# Patient Record
Sex: Female | Born: 1975 | Race: White | Hispanic: No | Marital: Married | Smoking: Never smoker
Health system: Southern US, Community
[De-identification: ages and names within clinical notes are randomized; demographics above are authoritative.]

## PROBLEM LIST (undated history)

## (undated) DIAGNOSIS — J302 Other seasonal allergic rhinitis: Secondary | ICD-10-CM

## (undated) HISTORY — PX: NO PAST SURGERIES: SHX2092

## (undated) HISTORY — PX: WISDOM TOOTH EXTRACTION: SHX21

## (undated) HISTORY — PX: EYE SURGERY: SHX253

---

## 2000-02-15 ENCOUNTER — Other Ambulatory Visit: Admission: RE | Admit: 2000-02-15 | Discharge: 2000-02-15 | Payer: Self-pay | Admitting: *Deleted

## 2004-09-25 ENCOUNTER — Other Ambulatory Visit: Admission: RE | Admit: 2004-09-25 | Discharge: 2004-09-25 | Payer: Self-pay | Admitting: *Deleted

## 2007-09-23 ENCOUNTER — Ambulatory Visit: Payer: Self-pay | Admitting: Sports Medicine

## 2007-09-23 DIAGNOSIS — M2020 Hallux rigidus, unspecified foot: Secondary | ICD-10-CM | POA: Insufficient documentation

## 2007-09-23 DIAGNOSIS — M7750 Other enthesopathy of unspecified foot: Secondary | ICD-10-CM | POA: Insufficient documentation

## 2008-09-03 ENCOUNTER — Encounter: Payer: Self-pay | Admitting: Cardiology

## 2008-09-28 ENCOUNTER — Encounter: Payer: Self-pay | Admitting: Cardiology

## 2008-11-13 ENCOUNTER — Ambulatory Visit: Payer: Self-pay | Admitting: Cardiology

## 2008-11-13 DIAGNOSIS — I059 Rheumatic mitral valve disease, unspecified: Secondary | ICD-10-CM | POA: Insufficient documentation

## 2008-11-28 ENCOUNTER — Telehealth: Payer: Self-pay | Admitting: Cardiology

## 2008-11-30 ENCOUNTER — Encounter: Payer: Self-pay | Admitting: Cardiology

## 2008-11-30 ENCOUNTER — Ambulatory Visit: Payer: Self-pay

## 2009-04-14 ENCOUNTER — Inpatient Hospital Stay (HOSPITAL_COMMUNITY): Admission: AD | Admit: 2009-04-14 | Discharge: 2009-04-17 | Payer: Self-pay | Admitting: Obstetrics

## 2010-05-18 NOTE — L&D Delivery Note (Addendum)
Called to attend due to rapid progression.  Pt had a SVD of Viable Female infant, no complications. TIme of birth and weight pending.  Placenta delivered intact via CCT within 10 minutes of delivery. EBL ~ 200cc.  2nd degree perineal lac.  Dr. Cherly Hensen arrived shortly after placenta..  Delivery Note At 3:04 AM a viable and healthy female was delivered via Vaginal, Spontaneous Delivery (Presentation: ;  ).  APGAR: 9, 9; weight .   Placenta status: Intact Abnormal, Spontaneous.  Cord: 3 vessels with the following complications: None.  Cord pH: none  Anesthesia: None  Episiotomy: None Lacerations: 2nd degree Suture Repair: 3.0 chromic Est. Blood Loss (mL):   Mom to postpartum.  Baby to nursery-stable.  Nesiah Jump A 05/15/2011, 4:00 AM   Called by RN  @ 2:48 am that pt was 7 cm ROM. Orders given for admit.  Received called at home  Less than 5 mins later that pt had delivered in MAU. On arrival, placenta was delivered and 2nd degree perineal laceration was in need of repair

## 2010-08-20 LAB — URIC ACID: Uric Acid, Serum: 3.6 mg/dL (ref 2.4–7.0)

## 2010-08-20 LAB — URINALYSIS, ROUTINE W REFLEX MICROSCOPIC
Nitrite: NEGATIVE
Protein, ur: NEGATIVE mg/dL
Specific Gravity, Urine: 1.015 (ref 1.005–1.030)
Urobilinogen, UA: 0.2 mg/dL (ref 0.0–1.0)

## 2010-08-20 LAB — COMPREHENSIVE METABOLIC PANEL
ALT: 19 U/L (ref 0–35)
AST: 35 U/L (ref 0–37)
Albumin: 2.6 g/dL — ABNORMAL LOW (ref 3.5–5.2)
Alkaline Phosphatase: 201 U/L — ABNORMAL HIGH (ref 39–117)
Chloride: 104 mEq/L (ref 96–112)
GFR calc Af Amer: >60 mL/min (ref >60)
Potassium: 3.9 mEq/L (ref 3.5–5.1)
Total Bilirubin: 0.4 mg/dL (ref 0.3–1.2)

## 2010-08-20 LAB — CBC
HCT: 32.6 % — ABNORMAL LOW (ref 36.0–46.0)
MCHC: 33.6 g/dL (ref 30.0–36.0)
MCV: 86.4 fL (ref 78.0–100.0)
Platelets: 302 10*3/uL (ref 150–400)
RBC: 3.77 MIL/uL — ABNORMAL LOW (ref 3.87–5.11)
WBC: 12.6 10*3/uL — ABNORMAL HIGH (ref 4.0–10.5)
WBC: 16.6 10*3/uL — ABNORMAL HIGH (ref 4.0–10.5)

## 2010-08-20 LAB — URINE MICROSCOPIC-ADD ON

## 2011-02-24 ENCOUNTER — Ambulatory Visit
Admission: RE | Admit: 2011-02-24 | Discharge: 2011-02-24 | Disposition: A | Discharge status: Home / Self Care | Payer: BC Managed Care – PPO | Source: Ambulatory Visit | Attending: Obstetrics | Admitting: Obstetrics

## 2011-02-24 ENCOUNTER — Other Ambulatory Visit: Payer: Self-pay | Admitting: Obstetrics

## 2011-02-24 DIAGNOSIS — R0602 Shortness of breath: Secondary | ICD-10-CM

## 2011-02-24 DIAGNOSIS — R05 Cough: Secondary | ICD-10-CM

## 2011-02-24 DIAGNOSIS — IMO0001 Reserved for inherently not codable concepts without codable children: Secondary | ICD-10-CM

## 2011-02-24 DIAGNOSIS — R059 Cough, unspecified: Secondary | ICD-10-CM

## 2011-02-24 DIAGNOSIS — R062 Wheezing: Secondary | ICD-10-CM

## 2011-05-15 ENCOUNTER — Encounter (HOSPITAL_COMMUNITY): Payer: Self-pay | Admitting: *Deleted

## 2011-05-15 ENCOUNTER — Inpatient Hospital Stay (HOSPITAL_COMMUNITY)
Admission: AD | Admit: 2011-05-15 | Discharge: 2011-05-16 | DRG: 373 | Disposition: A | Discharge status: Home / Self Care | Payer: BC Managed Care – PPO | Source: Ambulatory Visit | Attending: Obstetrics and Gynecology | Admitting: Obstetrics and Gynecology

## 2011-05-15 DIAGNOSIS — O09529 Supervision of elderly multigravida, unspecified trimester: Secondary | ICD-10-CM | POA: Diagnosis present

## 2011-05-15 MED ORDER — IBUPROFEN 600 MG PO TABS
600.0000 mg | ORAL_TABLET | Freq: Four times a day (QID) | ORAL | Status: DC
  Administered 2011-05-15 – 2011-05-16 (×6): 600 mg via ORAL
  Filled 2011-05-15 (×6): qty 1

## 2011-05-15 MED ORDER — ONDANSETRON HCL 4 MG PO TABS: 4.0000 mg | ORAL_TABLET | ORAL | Status: DC | PRN

## 2011-05-15 MED ORDER — LORATADINE 10 MG PO TABS
10.0000 mg | ORAL_TABLET | Freq: Every day | ORAL | Status: DC
  Administered 2011-05-15: 10 mg via ORAL
  Filled 2011-05-15 (×2): qty 1

## 2011-05-15 MED ORDER — ZOLPIDEM TARTRATE 5 MG PO TABS: 5.0000 mg | ORAL_TABLET | Freq: Every evening | ORAL | Status: DC | PRN

## 2011-05-15 MED ORDER — ONDANSETRON HCL 4 MG/2ML IJ SOLN: 4.0000 mg | INTRAMUSCULAR | Status: DC | PRN

## 2011-05-15 MED ORDER — WITCH HAZEL-GLYCERIN EX PADS
1.0000 "application " | MEDICATED_PAD | CUTANEOUS | Status: DC | PRN
  Administered 2011-05-15: 1 via TOPICAL

## 2011-05-15 MED ORDER — OXYTOCIN 10 UNIT/ML IJ SOLN
INTRAMUSCULAR | Status: AC
  Filled 2011-05-15: qty 1

## 2011-05-15 MED ORDER — DIBUCAINE 1 % RE OINT
1.0000 "application " | TOPICAL_OINTMENT | RECTAL | Status: DC | PRN
  Administered 2011-05-15: 1 via RECTAL
  Filled 2011-05-15: qty 28

## 2011-05-15 MED ORDER — LORATADINE 10 MG PO TABS
10.0000 mg | ORAL_TABLET | Freq: Every day | ORAL | Status: DC
  Filled 2011-05-15 (×2): qty 1

## 2011-05-15 MED ORDER — DIPHENHYDRAMINE HCL 25 MG PO CAPS: 25.0000 mg | ORAL_CAPSULE | Freq: Four times a day (QID) | ORAL | Status: DC | PRN

## 2011-05-15 MED ORDER — BUDESONIDE 180 MCG/ACT IN AEPB: 2.0000 | INHALATION_SPRAY | Freq: Two times a day (BID) | RESPIRATORY_TRACT | Status: DC

## 2011-05-15 MED ORDER — LANOLIN HYDROUS EX OINT: TOPICAL_OINTMENT | CUTANEOUS | Status: DC | PRN

## 2011-05-15 MED ORDER — SIMETHICONE 80 MG PO CHEW: 80.0000 mg | CHEWABLE_TABLET | ORAL | Status: DC | PRN

## 2011-05-15 MED ORDER — BENZOCAINE-MENTHOL 20-0.5 % EX AERO
INHALATION_SPRAY | CUTANEOUS | Status: AC
  Administered 2011-05-15: 1 via TOPICAL
  Filled 2011-05-15: qty 56

## 2011-05-15 MED ORDER — BENZOCAINE-MENTHOL 20-0.5 % EX AERO
1.0000 "application " | INHALATION_SPRAY | CUTANEOUS | Status: DC | PRN
  Administered 2011-05-15: 1 via TOPICAL

## 2011-05-15 MED ORDER — TETANUS-DIPHTH-ACELL PERTUSSIS 5-2.5-18.5 LF-MCG/0.5 IM SUSP: 0.5000 mL | Freq: Once | INTRAMUSCULAR | Status: DC

## 2011-05-15 MED ORDER — SENNOSIDES-DOCUSATE SODIUM 8.6-50 MG PO TABS
2.0000 | ORAL_TABLET | Freq: Every day | ORAL | Status: DC
  Administered 2011-05-15: 2 via ORAL

## 2011-05-15 MED ORDER — PRENATAL MULTIVITAMIN CH
1.0000 | ORAL_TABLET | Freq: Every day | ORAL | Status: DC
  Administered 2011-05-15 – 2011-05-16 (×2): 1 via ORAL
  Filled 2011-05-15 (×2): qty 1

## 2011-05-15 MED ORDER — FLUTICASONE PROPIONATE HFA 44 MCG/ACT IN AERO: 2.0000 | INHALATION_SPRAY | Freq: Two times a day (BID) | RESPIRATORY_TRACT | Status: DC

## 2011-05-15 MED ORDER — OXYCODONE-ACETAMINOPHEN 5-325 MG PO TABS: 1.0000 | ORAL_TABLET | ORAL | Status: DC | PRN

## 2011-05-15 NOTE — Progress Notes (Signed)
Entered room to find pt standing over bed bearing down.  Pt having bowel movement on floor.  Requested pt move to bed.  CNM F. Cres-Dishmon at desk and called to room.  Pt 10/100/+4.

## 2011-05-15 NOTE — Progress Notes (Signed)
PPD 0 SVD - precip birth in MAU  S:  Reports feeling well, denies PEC s/s             Tolerating po/ No nausea or vomiting             Bleeding is light             Pain controlled with motrin             Up ad lib / ambulatory  Newborn  Information for the patient's newborn:  Priscilla, Kirstein [[id number]]  female  breast feeding  / Circumcision planned   O:  A & O x 3 NAD             VS:  Filed Vitals:   05/15/11 0605 05/15/11 0609 05/15/11 0610 05/15/11 1000  BP: 132/86 128/90 134/93 118/76  Pulse: 76 83 99 71  Temp: 97.4 F (36.3 C)   97.9 F (36.6 C)  TempSrc: Oral   Oral  Resp: 16   18  Height:      Weight:        LABS: No results found for this basename: HGB:2,HCT:2 in the last 72 hours  I&O:        Lungs: Clear and unlabored  Heart: regular rate and rhythm / no mumurs  Abdomen: soft, non-tender, non-distended              Fundus: firm, non-tender, U-1  Perineum: repair intact, no edema, ice in place  Lochia: small  Extremities: no edema, no calf pain or tenderness, neg  Homans    A/P: PPD # 0 35 y.o., G3P1001 S/P:spontaneous vaginal   Principal Problem:  *PP care - s/p SVD 12/28   Doing well - stable status  Routine post partum orders  Labile BP, cont to monitor.  Arlan Organ CNM, MSN 05/15/2011, 11:27 AM

## 2011-05-15 NOTE — Progress Notes (Signed)
Pt up ambulating around bed per request.

## 2011-05-15 NOTE — Progress Notes (Signed)
Pt to room 112 at this time.

## 2011-05-15 NOTE — Progress Notes (Signed)
Pt presents to mau for c/o ctx and ROM.  States medium gush of fluid at 0124am.  Appeared clear.  Ctx followed.

## 2011-05-15 NOTE — H&P (Signed)
Chelsea Austin is a 35 y.o. female presenting for admission 2nd to active labor and subsequent delivery in MAU( see delivery note) Maternal Medical History:  Reason for admission: Reason for admission: rupture of membranes.  Fetal activity: Perceived fetal activity is normal.      OB History    Grav Para Term Preterm Abortions TAB SAB Ect Mult Living   3 2 1       1      Past Medical History  Diagnosis Date  . Asthma    Past Surgical History  Procedure Date  . No past surgeries    Family History: family history is not on file. Social History:  reports that she has never smoked. She does not have any smokeless tobacco history on file. She reports that she does not drink alcohol or use illicit drugs.  ROS  Dilation: 7 Effacement (%): 100 Station: 0;-1 Exam by:: Humphrey Rolls, RN Blood pressure 147/77, pulse 83, unknown if currently breastfeeding. Exam Physical Exam  Constitutional: She is oriented to person, place, and time. She appears well-developed and well-nourished.  HENT:  Head: Normocephalic.  Eyes: EOM are normal.  Cardiovascular: Regular rhythm.   Respiratory: Breath sounds normal.  GI:       Uterus firm @ umb sl tender  Musculoskeletal: She exhibits no edema.  Neurological: She is alert and oriented to person, place, and time.  Skin: Skin is warm.  Psychiatric: She has a normal mood and affect.   VE: see delivery note  Prenatal labs: ABO, Rh:   B positive Antibody:  neg Rubella:  Immune RPR:   NR HBsAg:   neg HIV:   neg GBS:   neg  Assessment/Plan: S/P SVD  P) admit. Routine labs. Routine pp care   Chelsea Austin A 05/15/2011, 4:05 AM

## 2011-05-15 NOTE — Progress Notes (Signed)
Notified of VE and ctx pattern. Notified of SROM.  Admit orders received.

## 2011-05-15 NOTE — Progress Notes (Deleted)
Pt ambulating around bed per request.

## 2011-05-15 NOTE — Progress Notes (Signed)
Dr. Cherly Hensen at bedside. Here for vaginal repair.

## 2011-05-15 NOTE — Progress Notes (Signed)
Pt may go to room 165. 

## 2011-05-16 LAB — CBC
Hemoglobin: 11.6 g/dL — ABNORMAL LOW (ref 12.0–15.0)
MCH: 27.3 pg (ref 26.0–34.0)
RBC: 4.25 MIL/uL (ref 3.87–5.11)
RDW: 14.6 % (ref 11.5–15.5)

## 2011-05-16 MED ORDER — ACETAMINOPHEN 325 MG PO TABS: ORAL_TABLET | ORAL | Status: DC

## 2011-05-16 MED ORDER — IBUPROFEN 200 MG PO TABS: ORAL_TABLET | ORAL | Status: DC

## 2011-05-16 NOTE — Discharge Summary (Signed)
Obstetric Discharge Summary Reason for Admission: onset of labor Prenatal Procedures: none Intrapartum Procedures: spontaneous vaginal delivery Postpartum Procedures: none Complications-Operative and Postpartum:  2nd  degree perineal laceration and precipitous labor / birth (<3hr)  LABS: Hemoglobin  Date Value Range Status  05/16/2011 11.6* 12.0-15.0 (g/dL) Final     HCT  Date Value Range Status  05/16/2011 35.4* 36.0-46.0 (%) Final    Discharge Diagnoses: Term Pregnancy-delivered after precipitous labor & birth (<3 hrs)  Discharge Information: Date: 05/16/2011 Activity: pelvic rest Diet: routine Medications: PNV, Ibuprofen and tylenol / albuteral / zrytec Condition: stable Instructions: refer to practice specific booklet Discharge to: home  Follow-up Information    Follow up with COUSINS,SHERONETTE A, MD. Make an appointment in 6 weeks.   Contact information:   [address] [place] [address] [phone]          Newborn Data: Live born female  Birth Weight: 9 lb 4 oz (4196 g) APGAR: 9, 9  Home with mother after newborn circumcision on PPD #1.  Teddi Badalamenti 05/16/2011, 9:19 AM

## 2011-05-16 NOTE — Progress Notes (Signed)
Patient ID: Chelsea Austin, female   DOB: November 30, 1975, 35 y.o.   MRN: [id number]  PPD 1 SVD  S:  Reports feeling well - desires early discharge to home today after newborn circ             Tolerating po/ No nausea or vomiting             Bleeding is light             Pain controlled withprescription NSAID's including motrin             Up ad lib / ambulatory  Newborn breast feeding  / Circumcision requested today   O:  A & O x 3 NAD             VS: Blood pressure 130/88, pulse 74, temperature 98.4 F (36.9 C), temperature source Oral, resp. rate 18, height 5\' 6"  (1.676 m), weight 80.287 kg (177 lb), unknown if currently breastfeeding.  LABS: WBC/Hgb/Hct/Plts:  10.1/11.6/35.4/222 (12/29 0536)   Lungs: Clear and unlabored  Heart: regular rate and rhythm   Abdomen: soft, non-tender, non-distended              Fundus: firm, non-tender, U-1  Lochia: light  Extremities: no edema, no calf pain or tenderness    A: PPD # 1   Doing well - stable status  P:  Routine post partum orders  Discharge home early today after newborn circ  [name] 05/16/2011, 9:11 AM

## 2012-08-22 ENCOUNTER — Encounter (HOSPITAL_COMMUNITY): Payer: Self-pay | Admitting: *Deleted

## 2012-08-26 ENCOUNTER — Encounter (HOSPITAL_COMMUNITY): Payer: Self-pay | Admitting: Pharmacist

## 2012-09-01 ENCOUNTER — Other Ambulatory Visit: Payer: Self-pay | Admitting: Obstetrics

## 2012-09-09 ENCOUNTER — Ambulatory Visit (HOSPITAL_COMMUNITY)
Admission: RE | Admit: 2012-09-09 | Discharge: 2012-09-09 | Disposition: A | Discharge status: Home / Self Care | Payer: BC Managed Care – PPO | Source: Ambulatory Visit | Attending: Obstetrics | Admitting: Obstetrics

## 2012-09-09 ENCOUNTER — Encounter (HOSPITAL_COMMUNITY)
Admission: RE | Disposition: A | Discharge status: Home / Self Care | Payer: Self-pay | Source: Ambulatory Visit | Attending: Obstetrics

## 2012-09-09 ENCOUNTER — Encounter (HOSPITAL_COMMUNITY): Payer: Self-pay | Admitting: Anesthesiology

## 2012-09-09 ENCOUNTER — Encounter (HOSPITAL_COMMUNITY): Payer: Self-pay | Admitting: *Deleted

## 2012-09-09 ENCOUNTER — Ambulatory Visit (HOSPITAL_COMMUNITY): Payer: BC Managed Care – PPO | Admitting: Anesthesiology

## 2012-09-09 DIAGNOSIS — T8339XA Other mechanical complication of intrauterine contraceptive device, initial encounter: Secondary | ICD-10-CM | POA: Insufficient documentation

## 2012-09-09 DIAGNOSIS — Z302 Encounter for sterilization: Secondary | ICD-10-CM | POA: Insufficient documentation

## 2012-09-09 HISTORY — PX: LAPAROSCOPIC TUBAL LIGATION: SHX1937

## 2012-09-09 HISTORY — PX: HYSTEROSCOPY WITH D & C: SHX1775

## 2012-09-09 HISTORY — PX: IUD REMOVAL: SHX5392

## 2012-09-09 HISTORY — DX: Other seasonal allergic rhinitis: J30.2

## 2012-09-09 LAB — CBC
HCT: 41.2 % (ref 36.0–46.0)
Hemoglobin: 14.1 g/dL (ref 12.0–15.0)
RBC: 4.92 MIL/uL (ref 3.87–5.11)
WBC: 6.1 10*3/uL (ref 4.0–10.5)

## 2012-09-09 LAB — ABO/RH: ABO/RH(D): B POS

## 2012-09-09 LAB — TYPE AND SCREEN: Antibody Screen: NEGATIVE

## 2012-09-09 SURGERY — LIGATION, FALLOPIAN TUBE, LAPAROSCOPIC
Anesthesia: General | Laterality: Bilateral | Wound class: Clean

## 2012-09-09 MED ORDER — ONDANSETRON HCL 4 MG/2ML IJ SOLN
INTRAMUSCULAR | Status: DC | PRN
  Administered 2012-09-09: 4 mg via INTRAVENOUS

## 2012-09-09 MED ORDER — LIDOCAINE HCL (CARDIAC) 20 MG/ML IV SOLN
INTRAVENOUS | Status: DC | PRN
  Administered 2012-09-09: 60 mg via INTRAVENOUS

## 2012-09-09 MED ORDER — PROPOFOL 10 MG/ML IV EMUL
INTRAVENOUS | Status: DC | PRN
  Administered 2012-09-09: 200 mg via INTRAVENOUS

## 2012-09-09 MED ORDER — FENTANYL CITRATE 0.05 MG/ML IJ SOLN
25.0000 ug | INTRAMUSCULAR | Status: DC | PRN
  Administered 2012-09-09: 50 ug via INTRAVENOUS

## 2012-09-09 MED ORDER — BUPIVACAINE HCL 0.5 % IJ SOLN
INTRAMUSCULAR | Status: DC | PRN
  Administered 2012-09-09: 20 mL

## 2012-09-09 MED ORDER — FENTANYL CITRATE 0.05 MG/ML IJ SOLN
INTRAMUSCULAR | Status: AC
  Filled 2012-09-09: qty 5

## 2012-09-09 MED ORDER — LIDOCAINE HCL (CARDIAC) 20 MG/ML IV SOLN
INTRAVENOUS | Status: AC
  Filled 2012-09-09: qty 5

## 2012-09-09 MED ORDER — KETOROLAC TROMETHAMINE 30 MG/ML IJ SOLN: 15.0000 mg | Freq: Once | INTRAMUSCULAR | Status: DC | PRN

## 2012-09-09 MED ORDER — EPHEDRINE SULFATE 50 MG/ML IJ SOLN
INTRAMUSCULAR | Status: DC | PRN
  Administered 2012-09-09: 15 mg via INTRAVENOUS

## 2012-09-09 MED ORDER — OXYCODONE-ACETAMINOPHEN 5-325 MG PO TABS: 2.0000 | ORAL_TABLET | ORAL | Status: DC | PRN

## 2012-09-09 MED ORDER — ONDANSETRON HCL 4 MG/2ML IJ SOLN
INTRAMUSCULAR | Status: AC
  Filled 2012-09-09: qty 2

## 2012-09-09 MED ORDER — ATROPINE SULFATE 0.4 MG/ML IJ SOLN
INTRAMUSCULAR | Status: AC
  Filled 2012-09-09: qty 1

## 2012-09-09 MED ORDER — GLYCOPYRROLATE 0.2 MG/ML IJ SOLN
INTRAMUSCULAR | Status: AC
  Filled 2012-09-09: qty 2

## 2012-09-09 MED ORDER — KETOROLAC TROMETHAMINE 30 MG/ML IJ SOLN
INTRAMUSCULAR | Status: AC
  Filled 2012-09-09: qty 1

## 2012-09-09 MED ORDER — ROCURONIUM BROMIDE 50 MG/5ML IV SOLN
INTRAVENOUS | Status: AC
  Filled 2012-09-09: qty 1

## 2012-09-09 MED ORDER — NEOSTIGMINE METHYLSULFATE 1 MG/ML IJ SOLN
INTRAMUSCULAR | Status: DC | PRN
  Administered 2012-09-09: 3 mg via INTRAVENOUS

## 2012-09-09 MED ORDER — FENTANYL CITRATE 0.05 MG/ML IJ SOLN
INTRAMUSCULAR | Status: DC | PRN
  Administered 2012-09-09 (×2): 100 ug via INTRAVENOUS

## 2012-09-09 MED ORDER — ROCURONIUM BROMIDE 100 MG/10ML IV SOLN
INTRAVENOUS | Status: DC | PRN
  Administered 2012-09-09: 30 mg via INTRAVENOUS

## 2012-09-09 MED ORDER — MIDAZOLAM HCL 5 MG/5ML IJ SOLN
INTRAMUSCULAR | Status: DC | PRN
  Administered 2012-09-09: 2 mg via INTRAVENOUS

## 2012-09-09 MED ORDER — VASOPRESSIN 20 UNIT/ML IJ SOLN
INTRAMUSCULAR | Status: DC | PRN
  Administered 2012-09-09: 20 [IU]

## 2012-09-09 MED ORDER — SODIUM CHLORIDE 0.9 % IR SOLN
Status: DC | PRN
  Administered 2012-09-09: 3000 mL

## 2012-09-09 MED ORDER — NEOSTIGMINE METHYLSULFATE 1 MG/ML IJ SOLN
INTRAMUSCULAR | Status: AC
  Filled 2012-09-09: qty 1

## 2012-09-09 MED ORDER — BUPIVACAINE HCL (PF) 0.25 % IJ SOLN
INTRAMUSCULAR | Status: DC | PRN
  Administered 2012-09-09: 10 mL

## 2012-09-09 MED ORDER — SILVER NITRATE-POT NITRATE 75-25 % EX MISC
CUTANEOUS | Status: AC
  Filled 2012-09-09: qty 3

## 2012-09-09 MED ORDER — FENTANYL CITRATE 0.05 MG/ML IJ SOLN
INTRAMUSCULAR | Status: AC
  Administered 2012-09-09: 50 ug via INTRAVENOUS
  Filled 2012-09-09: qty 2

## 2012-09-09 MED ORDER — ATROPINE SULFATE 0.4 MG/ML IJ SOLN
INTRAMUSCULAR | Status: DC | PRN
  Administered 2012-09-09: 0.4 mg via INTRAVENOUS

## 2012-09-09 MED ORDER — KETOROLAC TROMETHAMINE 30 MG/ML IJ SOLN
INTRAMUSCULAR | Status: DC | PRN
  Administered 2012-09-09: 30 mg via INTRAVENOUS

## 2012-09-09 MED ORDER — VASOPRESSIN 20 UNIT/ML IJ SOLN
INTRAMUSCULAR | Status: AC
  Filled 2012-09-09: qty 1

## 2012-09-09 MED ORDER — GLYCOPYRROLATE 0.2 MG/ML IJ SOLN
INTRAMUSCULAR | Status: DC | PRN
  Administered 2012-09-09: 0.4 mg via INTRAVENOUS

## 2012-09-09 MED ORDER — MIDAZOLAM HCL 2 MG/2ML IJ SOLN
INTRAMUSCULAR | Status: AC
  Filled 2012-09-09: qty 2

## 2012-09-09 MED ORDER — CHLOROPROCAINE HCL 1 % IJ SOLN
INTRAMUSCULAR | Status: AC
  Filled 2012-09-09: qty 30

## 2012-09-09 MED ORDER — BUPIVACAINE HCL (PF) 0.5 % IJ SOLN
INTRAMUSCULAR | Status: AC
  Filled 2012-09-09: qty 30

## 2012-09-09 MED ORDER — PROPOFOL 10 MG/ML IV EMUL
INTRAVENOUS | Status: AC
  Filled 2012-09-09: qty 20

## 2012-09-09 MED ORDER — BUPIVACAINE HCL (PF) 0.25 % IJ SOLN
INTRAMUSCULAR | Status: AC
  Filled 2012-09-09: qty 30

## 2012-09-09 MED ORDER — EPHEDRINE 5 MG/ML INJ
INTRAVENOUS | Status: AC
  Filled 2012-09-09: qty 10

## 2012-09-09 MED ORDER — ACETAMINOPHEN 160 MG/5ML PO SOLN
ORAL | Status: AC
  Administered 2012-09-09: 995 mg
  Filled 2012-09-09: qty 40.6

## 2012-09-09 MED ORDER — LACTATED RINGERS IV SOLN
INTRAVENOUS | Status: DC
  Administered 2012-09-09 (×2): via INTRAVENOUS

## 2012-09-09 SURGICAL SUPPLY — 45 items
ADH SKN CLS APL DERMABOND .7 (GAUZE/BANDAGES/DRESSINGS) ×3
BLADE SURG 15 STRL LF C SS BP (BLADE) ×3 IMPLANT
BLADE SURG 15 STRL SS (BLADE) ×4
CANISTER SUCTION 2500CC (MISCELLANEOUS) ×4 IMPLANT
CATH ROBINSON RED A/P 16FR (CATHETERS) ×2 IMPLANT
CHLORAPREP W/TINT 26ML (MISCELLANEOUS) ×4 IMPLANT
CLIP FILSHIE TUBAL LIGA STRL (Clip) ×2 IMPLANT
CLOTH BEACON ORANGE TIMEOUT ST (SAFETY) ×4 IMPLANT
CONTAINER PREFILL 10% NBF 60ML (FORM) ×4 IMPLANT
DERMABOND ADVANCED (GAUZE/BANDAGES/DRESSINGS) ×1
DERMABOND ADVANCED .7 DNX12 (GAUZE/BANDAGES/DRESSINGS) ×3 IMPLANT
DRESSING TELFA 8X3 (GAUZE/BANDAGES/DRESSINGS) ×4 IMPLANT
ELECT REM PT RETURN 9FT ADLT (ELECTROSURGICAL)
ELECTRODE REM PT RTRN 9FT ADLT (ELECTROSURGICAL) IMPLANT
GLOVE BIO SURGEON STRL SZ 6.5 (GLOVE) ×4 IMPLANT
GLOVE BIOGEL PI IND STRL 7.0 (GLOVE) ×6 IMPLANT
GLOVE BIOGEL PI INDICATOR 7.0 (GLOVE) ×2
GLOVE ECLIPSE 6.5 STRL STRAW (GLOVE) ×2 IMPLANT
GLOVE ECLIPSE 7.5 STRL STRAW (GLOVE) ×2 IMPLANT
GLOVE INDICATOR 7.0 STRL GRN (GLOVE) ×2 IMPLANT
GLOVE INDICATOR 8.0 STRL GRN (GLOVE) ×4 IMPLANT
GLOVE SURG SS PI 7.5 STRL IVOR (GLOVE) ×2 IMPLANT
GOWN PREVENTION PLUS LG XLONG (DISPOSABLE) ×8 IMPLANT
GOWN STRL REIN XL XLG (GOWN DISPOSABLE) ×8 IMPLANT
LOOP ANGLED CUTTING 22FR (CUTTING LOOP) IMPLANT
MANIPULATOR UTERINE 4.5 ZUMI (MISCELLANEOUS) ×4 IMPLANT
NEEDLE INSUFFLATION 120MM (ENDOMECHANICALS) ×4 IMPLANT
NS IRRIG 1000ML POUR BTL (IV SOLUTION) ×4 IMPLANT
PACK HYSTEROSCOPY LF (CUSTOM PROCEDURE TRAY) ×4 IMPLANT
PACK LAPAROSCOPY BASIN (CUSTOM PROCEDURE TRAY) ×4 IMPLANT
PAD OB MATERNITY 4.3X12.25 (PERSONAL CARE ITEMS) ×4 IMPLANT
RING FALLOPIAN BANDS (Ring) ×2 IMPLANT
STENT BALLN UTERINE 3CM 6FR (Stent) IMPLANT
STENT BALLN UTERINE 4CM 6FR (STENTS) IMPLANT
SUT VICRYL 0 UR6 27IN ABS (SUTURE) ×4 IMPLANT
SUT VICRYL 4-0 PS2 18IN ABS (SUTURE) ×6 IMPLANT
SYR 20CC LL (SYRINGE) ×2 IMPLANT
SYR 20ML ECCENTRIC (SYRINGE) ×2 IMPLANT
SYRINGE 10CC LL (SYRINGE) ×4 IMPLANT
TOWEL OR 17X24 6PK STRL BLUE (TOWEL DISPOSABLE) ×8 IMPLANT
TRAY FOLEY CATH 14FR (SET/KITS/TRAYS/PACK) ×2 IMPLANT
TROCAR XCEL NON BLADE 8MM B8LT (ENDOMECHANICALS) ×6 IMPLANT
TROCAR XCEL NON-BLD 5MMX100MML (ENDOMECHANICALS) ×2 IMPLANT
WARMER LAPAROSCOPE (MISCELLANEOUS) ×4 IMPLANT
WATER STERILE IRR 1000ML POUR (IV SOLUTION) ×4 IMPLANT

## 2012-09-09 NOTE — Anesthesia Postprocedure Evaluation (Signed)
  Anesthesia Post-op Note  Anesthesia Post Note  Patient: Chelsea Austin  Procedure(s) Performed: Procedure(s) (LRB): LAPAROSCOPIC TUBAL LIGATION with filshie clips (Bilateral) DILATATION AND CURETTAGE /HYSTEROSCOPY INTRAUTERINE DEVICE (IUD) REMOVAL  Anesthesia type: General  Patient location: PACU  Post pain: Pain level controlled  Post assessment: Post-op Vital signs reviewed  Last Vitals:  Filed Vitals:   09/09/12 1345  BP: 121/74  Pulse: 72  Temp:   Resp: 16    Post vital signs: Reviewed  Level of consciousness: sedated  Complications: No apparent anesthesia complications

## 2012-09-09 NOTE — Op Note (Signed)
09/09/2012  12:45 PM  PATIENT:  Chelsea Austin  37 y.o. female  PRE-OPERATIVE DIAGNOSIS:  Malpositioned intrauterine device, Undesired Fertility  [phone], [phone]  POST-OPERATIVE DIAGNOSIS:  Malpositioned intrauterine device, Undesired Fertility  PROCEDURE:  Hysteroscopy with hysteroscopic removal of IUD, laparoscopic tubal ligation  SURGEON:  Surgeon(s) and Role:    * Alder Murri A. Ernestina Penna, MD - Primary  PHYSICIAN ASSISTANT:   ASSISTANTS: none   ANESTHESIA:   general  EBL:  Total I/O In: 1000 [I.V.:1000] Out: 260 [Urine:250; Blood:10]  BLOOD ADMINISTERED:none  DRAINS: Urinary Catheter (Foley)   LOCAL MEDICATIONS USED:  OTHER paracervical block using half percent Marcaine with 6 units of vasopressin, 20 cc used 10 each at 5 and 7:00. On the abdomen half percent plain Marcaine 5 cc used at the umbilicus 5 cc who suprapubically  SPECIMEN:  No Specimen  DISPOSITION OF SPECIMEN:  N/A  COUNTS:  YES  TOURNIQUET:  * No tourniquets in log *  DICTATION: .Note written in EPIC  PLAN OF CARE: Discharge to home after PACU  PATIENT DISPOSITION:  PACU - hemodynamically stable.  Findings. Normal uterus with IUD partially embedded into the myometrium, normal gallbladder, normal liver edge, normal appendix, normal uterus with no evidence of serosal perforation, normal ovaries bilaterally, normal fallopian tube, hemostasis post procedure.    Delay start of Pharmacological VTE agent (>24hrs) due to surgical blood loss or risk of bleeding: yes  Indications: Multiparous patient with undesired fertility who had previously done well with an IUD. Current IUD has done well for the past year however patient presented for routine annual exam an IUD strings were not seen. On followup ultrasound IUD embedded in the myometrium was noted and the decision was made for hysteroscopic removal with possible laparoscopic removal if needed and bilateral tubal ligation. Patient understood options of repeat IUD,  ESSURE and other forms of hormonal contraception.   After informed consent including discussion of risks of bleeding, infection discussed with patient., The patient was taken to the operating room where general anesthesia was initiated without difficulty. She was prepped and draped in normal sterile fashion in the dorsal supine lithotomy position. A Foley catheter was inserted sterilely into the bladder. A bimanual examination was done to assess the size and position of the uterus. A speculum was placed in the vagina an injection was placed paracervically.  A single-tooth tenaculum was used to grasp the anterior lip of the cervix and the small hysteroscope was inserted into the uterus. The IUD string IUD stem and proximal portion of the old have some fatigue were seen. The distal ends of the T. portion of the IUD was seen taking into the myometrium. A grasper was placed through the operating channel in the hysteroscope grasp the stem of the IUD and with the second pull the IUD was removed without complication. No bleeding was noted. The hysteroscope was removed and a ZUMI uterine manipulator was placed into the uterus without difficulty. Tenaculum was removed. The tenaculum site was hemostatic and the case was terminated.  Half percent Marcaine was infused into the umbilicus. A 5 mm skin incision was made in the infraumbilical fold and a varies needle was inserted without difficulty pneumoperitoneum was created to 15 mm mercury after first testing with a saline filled syringe transferor intraperitoneal placement. Once pneumoperitoneum was created a 5 mm non-bladed trocar was placed into the peritoneal cavity. Intraperitoneal placement was confirmed with the use of the hysteroscope. Pneumoperitoneum was maintained. Brief survey of the patient's abdomen and pelvis revealed  normal findings as above.  An 8 mm skin incision was made in the suprapubic region after first infusing with 5 cc of half percent Marcaine. A  non-bladed 8 mm trocar was inserted under direct visualization.  The fallopian tubes were identified carried out to the fimbriated end, posterior ovarian fossa were evaluated and found to be normal free of adhesions free of any evidence of endometriosis. Bilateral ureters were seen peristalsing in the pelvic sidewall. The fallopian ring applicator was used to grasp the mid isthmic portion of the right fallopian tube and the tube was pulled up into the Falope ring applicator. 2 attempts were done however the Falope ring had difficulty in deploying. Due to concern of lacerating the fallopian tube the mesosalpinx decision was made to terminate attempts with the Falope ring applicator and the Filshie clip was applied with ease. The Filshie clip was read loaded and the left fallopian tube had a clip placed along the mid isthmic portion. Hemostasis was assured as pneumoperitoneum was released. The 8 mm trocar was removed without difficulty. Hemostasis was noted the umbilical trocar was removed under direct visualization. Due to the small size of the fascial incisions no fascial closure was done. The skin incisions were closed with 4-0 Vicryl in subcutaneous fashion and covered with Dermabond.  The Foley and the ZUMI manipulator were removed. Hemostasis was noted. Patient was woken from general anesthesia having tolerated the procedure well sponge lap and needle counts were correct x3

## 2012-09-09 NOTE — Transfer of Care (Signed)
Immediate Anesthesia Transfer of Care Note  Patient: Chelsea Austin  Procedure(s) Performed: Procedure(s) with comments: LAPAROSCOPIC TUBAL LIGATION with filshie clips (Bilateral) - Requests Fallope Rings and Non-bladed 8mm Trocar. DILATATION AND CURETTAGE /HYSTEROSCOPY INTRAUTERINE DEVICE (IUD) REMOVAL  Patient Location: PACU  Anesthesia Type:General  Level of Consciousness: awake, alert  and oriented  Airway & Oxygen Therapy: Patient Spontanous Breathing and Patient connected to nasal cannula oxygen  Post-op Assessment: Report given to PACU RN and Post -op Vital signs reviewed and stable  Post vital signs: stable  Complications: No apparent anesthesia complications

## 2012-09-09 NOTE — H&P (Signed)
See scanned H&P for full details.  Healthy multip pt w/ h/o asthma, here for removal of malpositioned IUD and lap TL. R/B of procedure d/w pt.    Chelsea Austin A. 09/09/2012 11:29 AM

## 2012-09-09 NOTE — OR Nursing (Signed)
09/09/2012 @ 1015 History and physical on paper on the chart.

## 2012-09-09 NOTE — Brief Op Note (Signed)
09/09/2012  12:45 PM  PATIENT:  Chelsea Austin  37 y.o. female  PRE-OPERATIVE DIAGNOSIS:  Malpositioned intrauterine device, Undesired Fertility  [phone], [phone]  POST-OPERATIVE DIAGNOSIS:  Malpositioned intrauterine device, Undesired Fertility  PROCEDURE:  Hysteroscopy with hysteroscopic removal of IUD, laparoscopic tubal ligation  SURGEON:  Surgeon(s) and Role:    * Jazlynn Nemetz A. Ernestina Penna, MD - Primary  PHYSICIAN ASSISTANT:   ASSISTANTS: none   ANESTHESIA:   general  EBL:  Total I/O In: 1000 [I.V.:1000] Out: 260 [Urine:250; Blood:10]  BLOOD ADMINISTERED:none  DRAINS: Urinary Catheter (Foley)   LOCAL MEDICATIONS USED:  OTHER paracervical block using half percent Marcaine with 6 units of vasopressin, 20 cc used 10 each at 5 and 7:00. On the abdomen half percent plain Marcaine 5 cc used at the umbilicus 5 cc who suprapubically  SPECIMEN:  No Specimen  DISPOSITION OF SPECIMEN:  N/A  COUNTS:  YES  TOURNIQUET:  * No tourniquets in log *  DICTATION: .Note written in EPIC  PLAN OF CARE: Discharge to home after PACU  PATIENT DISPOSITION:  PACU - hemodynamically stable.   Delay start of Pharmacological VTE agent (>24hrs) due to surgical blood loss or risk of bleeding: yes

## 2012-09-09 NOTE — Anesthesia Preprocedure Evaluation (Signed)
Anesthesia Evaluation  Patient identified by MRN, date of birth, ID band Patient awake    Reviewed: Allergy & Precautions, H&P , NPO status , Patient's Chart, lab work & pertinent test results, reviewed documented beta blocker date and time   History of Anesthesia Complications Negative for: history of anesthetic complications  Airway Mallampati: III TM Distance: >3 FB Neck ROM: full    Dental  (+) Teeth Intact   Pulmonary asthma (hasnt used albuterol in months (chemical irritants)) ,  breath sounds clear to auscultation  Pulmonary exam normal       Cardiovascular Exercise Tolerance: Good negative cardio ROS  Rhythm:regular Rate:Normal     Neuro/Psych negative neurological ROS  negative psych ROS   GI/Hepatic negative GI ROS, Neg liver ROS,   Endo/Other  negative endocrine ROS  Renal/GU negative Renal ROS  negative genitourinary   Musculoskeletal   Abdominal   Peds  Hematology negative hematology ROS (+)   Anesthesia Other Findings   Reproductive/Obstetrics negative OB ROS                           Anesthesia Physical Anesthesia Plan  ASA: II  Anesthesia Plan: General ETT   Post-op Pain Management:    Induction:   Airway Management Planned:   Additional Equipment:   Intra-op Plan:   Post-operative Plan:   Informed Consent: I have reviewed the patients History and Physical, chart, labs and discussed the procedure including the risks, benefits and alternatives for the proposed anesthesia with the patient or authorized representative who has indicated his/her understanding and acceptance.   Dental Advisory Given  Plan Discussed with: CRNA and Surgeon  Anesthesia Plan Comments:         Anesthesia Quick Evaluation

## 2012-09-12 ENCOUNTER — Encounter (HOSPITAL_COMMUNITY): Payer: Self-pay | Admitting: Obstetrics

## 2012-10-07 IMAGING — CR DG CHEST 2V
2 series · 2 of 2 positions shown · non-contrast
Comparison: None.

CLINICAL DATA: Cough for 4 weeks, history of asthma, the patient is
27 weeks pregnant and was lead shielded

CHEST - 2 VIEW

[view not recorded (1 of 2)]
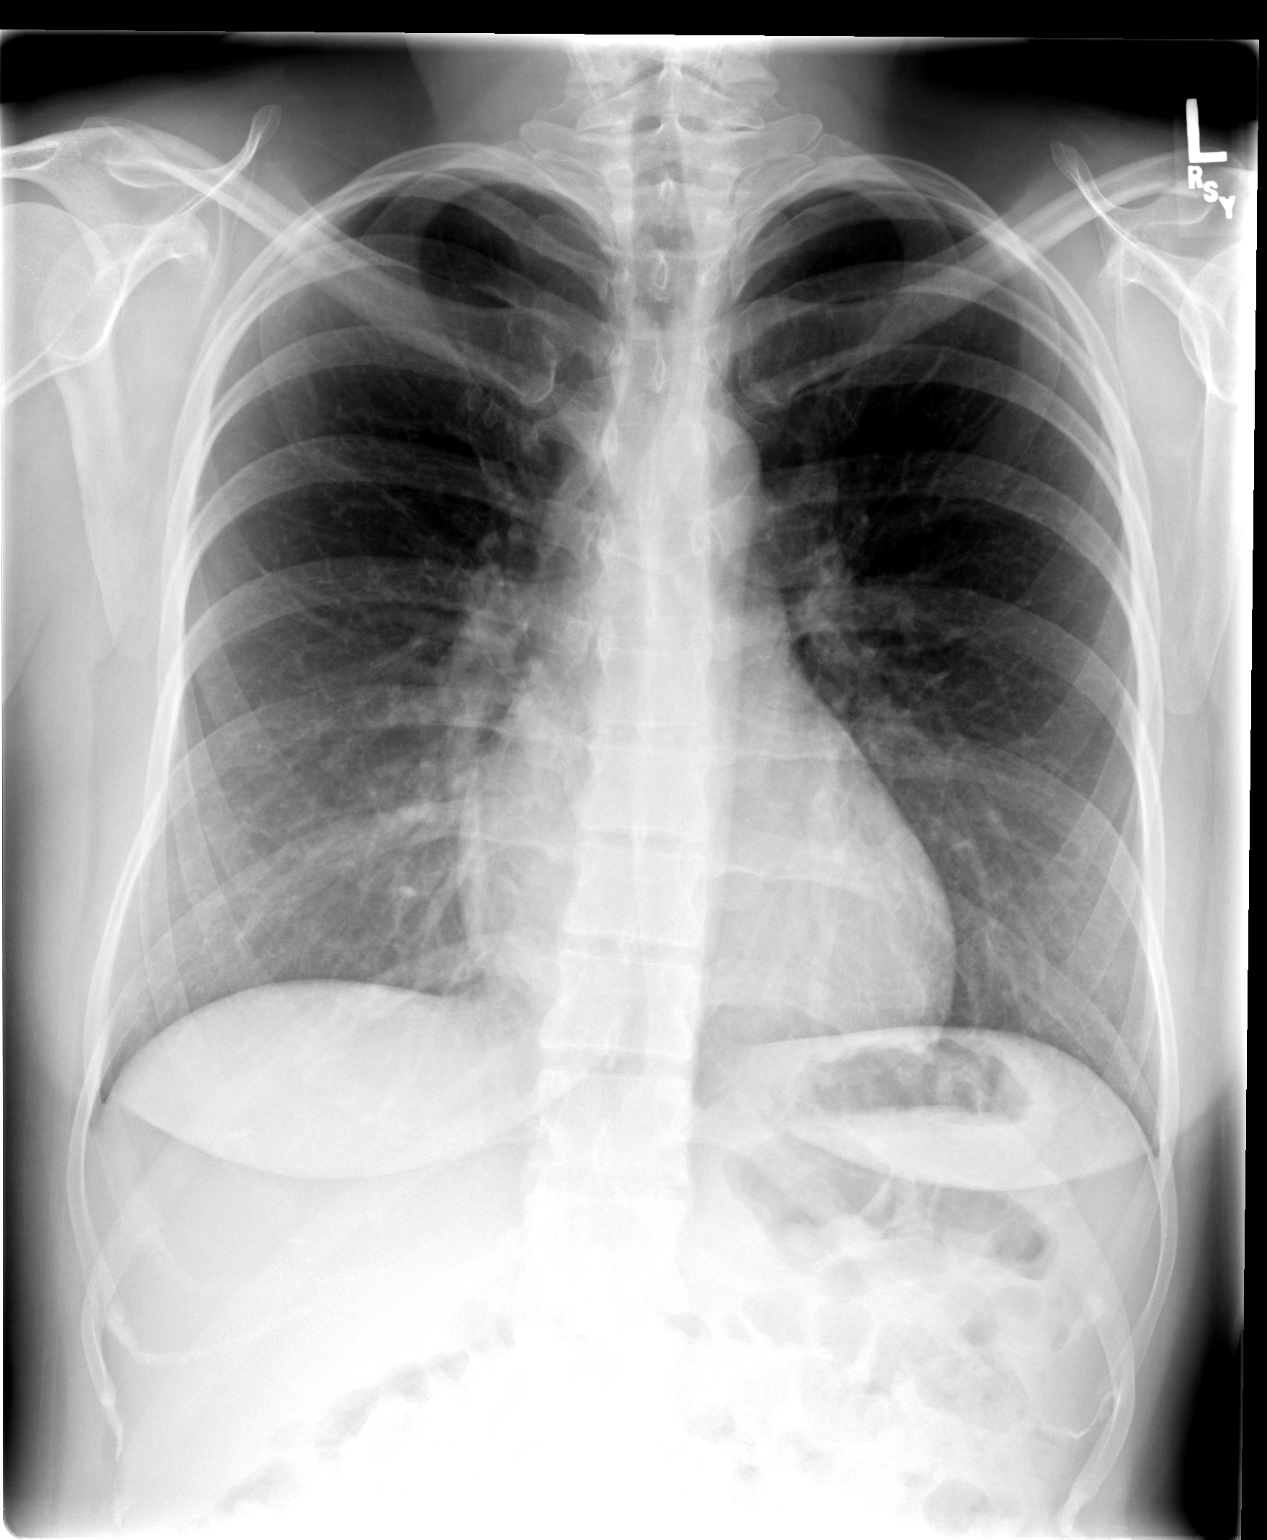

[view not recorded (2 of 2)]
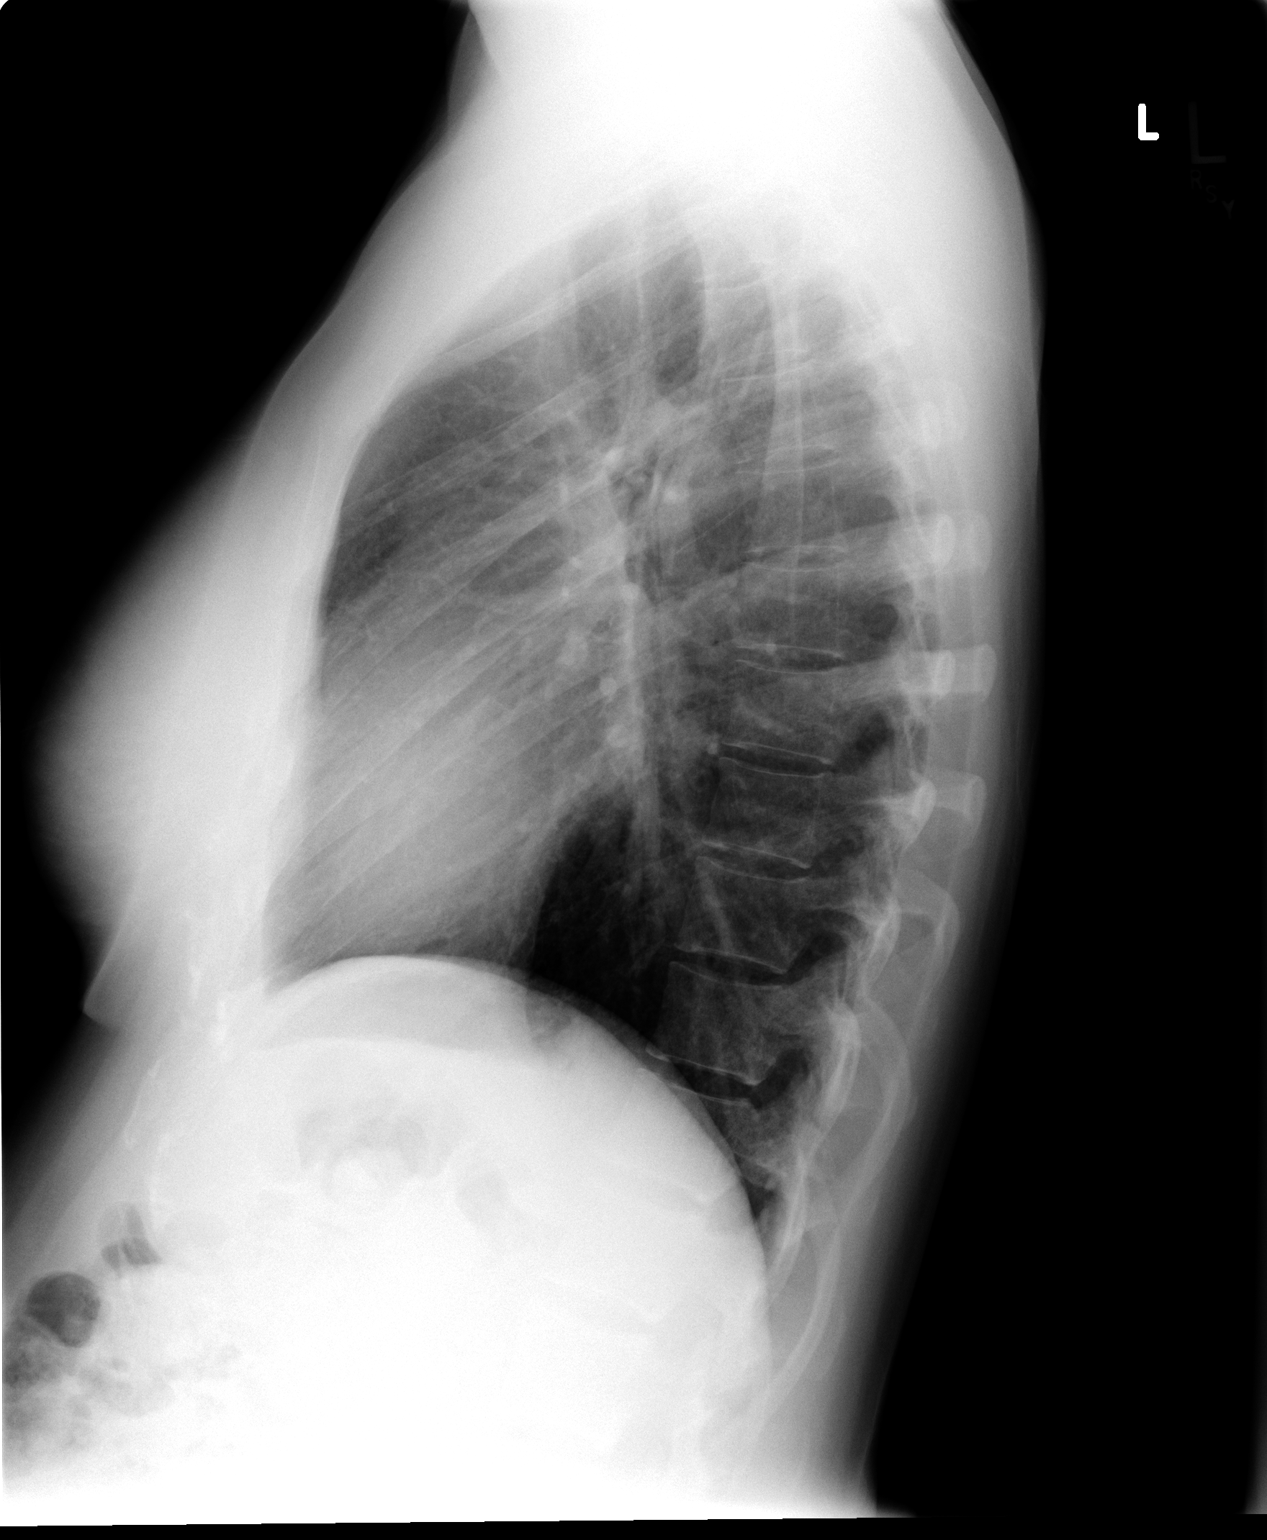

[2 of 2 positions shown; findings below may reference images not displayed]

FINDINGS: The lungs are clear.  No pneumonia is seen. Mediastinal
contours are normal.  The heart is within normal limits in size.
No bony abnormality is seen.
IMPRESSION: No active lung disease.

## 2014-03-19 ENCOUNTER — Encounter (HOSPITAL_COMMUNITY): Payer: Self-pay | Admitting: Obstetrics

## 2016-06-29 DIAGNOSIS — Z6823 Body mass index (BMI) 23.0-23.9, adult: Secondary | ICD-10-CM | POA: Diagnosis not present

## 2016-06-29 DIAGNOSIS — Z23 Encounter for immunization: Secondary | ICD-10-CM | POA: Diagnosis not present

## 2016-06-29 DIAGNOSIS — Z1151 Encounter for screening for human papillomavirus (HPV): Secondary | ICD-10-CM | POA: Diagnosis not present

## 2016-06-29 DIAGNOSIS — Z1231 Encounter for screening mammogram for malignant neoplasm of breast: Secondary | ICD-10-CM | POA: Diagnosis not present

## 2016-06-29 DIAGNOSIS — Z01419 Encounter for gynecological examination (general) (routine) without abnormal findings: Secondary | ICD-10-CM | POA: Diagnosis not present

## 2017-03-05 ENCOUNTER — Ambulatory Visit (INDEPENDENT_AMBULATORY_CARE_PROVIDER_SITE_OTHER): Payer: BLUE CROSS/BLUE SHIELD | Admitting: Podiatry

## 2017-03-05 ENCOUNTER — Ambulatory Visit: Payer: Self-pay | Admitting: Podiatry

## 2017-03-05 ENCOUNTER — Ambulatory Visit (INDEPENDENT_AMBULATORY_CARE_PROVIDER_SITE_OTHER): Payer: BLUE CROSS/BLUE SHIELD

## 2017-03-05 ENCOUNTER — Encounter: Payer: Self-pay | Admitting: Podiatry

## 2017-03-05 VITALS — BP 101/76 | HR 85 | Resp 16

## 2017-03-05 DIAGNOSIS — Q828 Other specified congenital malformations of skin: Secondary | ICD-10-CM

## 2017-03-05 DIAGNOSIS — M779 Enthesopathy, unspecified: Principal | ICD-10-CM

## 2017-03-05 DIAGNOSIS — M2022 Hallux rigidus, left foot: Secondary | ICD-10-CM | POA: Diagnosis not present

## 2017-03-05 DIAGNOSIS — M778 Other enthesopathies, not elsewhere classified: Secondary | ICD-10-CM

## 2017-03-05 DIAGNOSIS — M7752 Other enthesopathy of left foot: Secondary | ICD-10-CM | POA: Diagnosis not present

## 2017-03-07 NOTE — Progress Notes (Signed)
  Subjective:  Patient ID: Chelsea Austin, female    DOB: 10-Jul-1975,  MRN: [id number]  Chief Complaint  Patient presents with  . Foot Pain    Plantar forefoot left - tiny, callused area x 2 months, getting thicker and tender, tried filing, Bengay, and wears insoles in shoes-no help   41 y.o. female presents with the above complaint. Reports painful callus on the bottom of the left forefoot. Has tried insoles filing down and over-the-counter medications. States that the lesion is getting thick and painful. Has used orthotics in the past but states there is several years old.  Past Medical History:  Diagnosis Date  . Asthma    mild - rarely use inhaler - chemical induced  . PP care - s/p SVD 12/28 05/15/2011   svd x 2  . Seasonal allergies    Past Surgical History:  Procedure Laterality Date  . EYE SURGERY     lasik eye bilateral  . HYSTEROSCOPY W/D&C  09/09/2012   Procedure: DILATATION AND CURETTAGE /HYSTEROSCOPY;  Surgeon: Alphonsus SiasKelly A. Ernestina PennaFogleman, MD;  Location: [facility];  Service: Gynecology;;  . IUD REMOVAL  09/09/2012   Procedure: INTRAUTERINE DEVICE (IUD) REMOVAL;  Surgeon: Alphonsus SiasKelly A. Ernestina PennaFogleman, MD;  Location: [facility];  Service: Gynecology;;  . LAPAROSCOPIC TUBAL LIGATION Bilateral 09/09/2012   Procedure: LAPAROSCOPIC TUBAL LIGATION with filshie clips;  Surgeon: Tresa EndoKelly A. Ernestina PennaFogleman, MD;  Location: [facility];  Service: Gynecology;  Laterality: Bilateral;  Requests Fallope Rings and Non-bladed 8mm Trocar.  . NO PAST SURGERIES    . WISDOM TOOTH EXTRACTION      Current Outpatient Prescriptions:  .  cetirizine (ZYRTEC) 10 MG tablet, Take 10 mg by mouth daily., Disp: , Rfl:  .  ibuprofen (ADVIL,MOTRIN) 200 MG tablet, Take 200 mg by mouth every 6 (six) hours as needed for pain., Disp: , Rfl:   No Known Allergies Review of Systems all other systems reviewed and are negative Objective:   Vitals:   03/05/17 1117  BP: 101/76  Pulse: 85  Resp: 16   General AA&O x3. Normal mood and affect.    Vascular Dorsalis pedis and posterior tibial pulses  present 2+ bilaterally  Capillary refill normal to all digits. Pedal hair growth normal.  Neurologic Epicritic sensation grossly present.  Dermatologic No open lesions. Interspaces clear of maceration. Nails well groomed and normal in appearance. Punctate keratosis left plantar second metatarsal   Orthopedic: MMT 5/5 in dorsiflexion, plantarflexion, inversion, and eversion. Left temperature range of motion reduced with pain on range of motion    Assessment & Plan:  Patient was evaluated and treated and all questions answered.  Metatarsalgia with plantar forefoot callus -Lesion palliatively debrided -Discussed abnormal [place] pressure distraction about the forefoot. Discussed benefit of orthotics. -We'll set up an appointment for fabrication of orthotics  Hallux rigidus left -Would benefit from orthotic modification for hallux rigidus  Procedure: Paring of Lesion Rationale: painful hyperkeratotic lesion Type of Debridement: manual, sharp debridement. Instrumentation: 15 blade Number of Lesions: 1

## 2017-03-11 ENCOUNTER — Other Ambulatory Visit: Payer: Self-pay | Admitting: Orthotics

## 2017-03-23 DIAGNOSIS — Z23 Encounter for immunization: Secondary | ICD-10-CM | POA: Diagnosis not present

## 2017-04-19 ENCOUNTER — Encounter: Payer: Self-pay | Admitting: Sports Medicine

## 2017-04-19 ENCOUNTER — Ambulatory Visit (INDEPENDENT_AMBULATORY_CARE_PROVIDER_SITE_OTHER): Payer: BLUE CROSS/BLUE SHIELD | Admitting: Sports Medicine

## 2017-04-19 DIAGNOSIS — L84 Corns and callosities: Secondary | ICD-10-CM | POA: Diagnosis not present

## 2017-04-19 NOTE — Progress Notes (Signed)
   Subjective:    Patient ID: Chelsea Austin, female    DOB: [birth year], 41 y.o.   MRN: [id number]  HPI Ms. Chelsea Austin is a 41 year old female with past medical history of hallux rigidus on the left and collapsed transverse metatarsal arch who presents for evaluation of L foot plantar callus.  Patient states that in July of this year she began to notice some pain and hyperkeratosis to the plantar aspect of the left foot.  Due to her busy summer and activities with children she did not seek medical attention during that time. She has been using Dr. Margart SicklesScholl's insoles, salicylic acid application and debriding with a pumice stone.  In October this year she saw a podiatrist who also debrided the foot callus.  The debridements did help alleviate pain somewhat but it continues to give her pain. Ms. Chelsea Austin reports that orthotics through the podiatrist office are cost prohibitive thus she is seeking alternatives.  Today she reports that the pain is worse with prolonged standing and prolonged ambulation.  She is able to run up to 9 miles a week which she completes without issue if she massages the callus and has soaked her foot recently.  No other complaints today.   Review of Systems  Constitutional: Negative. Eye: Negative. Ear/Nose/Mouth/Throat: Negative. Respiratory: Negative. Cardiovascular: Negative. Gastrointestinal: Negative. Hematology/Lymphatics: Negative. Endocrine: Negative. Immunologic: Negative. Musculoskeletal: see HPI Integumentary: Negative. Neurologic: Negative Psychiatric: Negative.    Objective:   Physical Exam  Gen.:Alert and oriented, no acute distress Eyes: EOMI, normal conjunctiva HEENT: Within normal limits, normocephalic, normal oropharynx  Neck: Supple, nontender Respiratory: symmetric chest wall expansion Cardiovascular: normal peripheral perfusion Neurologic: Alert, oriented, no focal defects Musculoskeletal L foot: Dime sized callus with area of central clearing, no  surrounding erythema, lesion is annular in shape and between the metatarsal heads between digits 1 and 2 on the left foot, tenderness to palpation to the callus and surrounding area, full range of motion at MTP joints of digits 1 and 2, no special testing performed, neurovascularly intact throughout Skin: Warm, dry     Assessment & Plan:  #Plantar callus -Patient with plantar callus secondary to metatarsal transverse arch collapse. -We have fitted her with IPK pads which immediately alleviated the pressure/pain to the plantar aspect of the foot both at rest and with ambulation. -We will trial the IPK pads initially and if pain is refractory to this intervention we will consider fashioning orthotics. -RTC in 4 weeks  Plan of care discussed with Dr. Margaretha Sheffieldraper who is in agreement with plan.  Vickii PennaAdam Culver, MD  Patient seen and evaluated with the resident. I agree with the above plan of care. IPK pad placed on a green sports insole. Patient will return to the office in 4 weeks for reevaluation. We may want to try metatarsal pads for transverse arch support once her callus has resolved. Of note, she got a pair of green sports insoles about 10 years ago from Dr Darrick PennaFields and did very well with him.

## 2017-05-06 ENCOUNTER — Ambulatory Visit: Payer: BLUE CROSS/BLUE SHIELD | Admitting: Sports Medicine

## 2017-05-20 ENCOUNTER — Encounter: Payer: Self-pay | Admitting: Sports Medicine

## 2017-05-20 ENCOUNTER — Ambulatory Visit (INDEPENDENT_AMBULATORY_CARE_PROVIDER_SITE_OTHER): Payer: BLUE CROSS/BLUE SHIELD | Admitting: Sports Medicine

## 2017-05-20 VITALS — BP 124/80 | Ht 66.0 in | Wt 152.0 lb

## 2017-05-20 DIAGNOSIS — L84 Corns and callosities: Secondary | ICD-10-CM | POA: Diagnosis not present

## 2017-05-20 NOTE — Progress Notes (Signed)
   Subjective:    Patient ID: Georgiann CockerSally T Vandenheuvel, female    DOB: 08-19-1975, 42 y.o.   MRN: [id number]  HPI Kennon RoundsSally comes in today for follow-up for a left foot plantar callus. It is still quite painful despite IPK pads. She continues to localize all of her pain to the area of the callus. She is growing frustrated with her pain and is asking about the possibility of surgical excision.    Review of Systems As above     Objective:   Physical Exam  Well-developed, well-nourished. No acute distress.  Left foot: There continues to be a small callus at the plantar aspect of the forefoot near the fourth metatarsal head. No surrounding erythema. It is tender to palpation.       Assessment & Plan:  Persistent left foot pain secondary to plantar callus  I would like for the patient to see Dr.Brent Evans. She has previously seen a podiatrist and he recommended custom orthotics but they are very expensive. She may benefit from some sort of surgical excision or debridement. I'll defer further workup and treatment to the discretion of Dr. Logan BoresEvans and the patient will follow-up with me as needed.

## 2017-05-24 ENCOUNTER — Encounter: Payer: Self-pay | Admitting: Podiatry

## 2017-05-24 ENCOUNTER — Ambulatory Visit (INDEPENDENT_AMBULATORY_CARE_PROVIDER_SITE_OTHER): Payer: BLUE CROSS/BLUE SHIELD | Admitting: Podiatry

## 2017-05-24 ENCOUNTER — Ambulatory Visit: Payer: BLUE CROSS/BLUE SHIELD | Admitting: Sports Medicine

## 2017-05-24 DIAGNOSIS — B070 Plantar wart: Secondary | ICD-10-CM | POA: Diagnosis not present

## 2017-05-26 NOTE — Progress Notes (Signed)
   Subjective: Patient presents today for follow up evaluation of pain and tenderness on the plantar aspect of the left foot.  She states she has orthotics through Dr. Margaretha Sheffieldraper and has had adjustments made to them twice, but they are still uncomfortable.  She reports the callus on the left foot has worsened.  Patient denies trauma.  Patient is here for further evaluation and treatment.   Past Medical History:  Diagnosis Date  . Asthma    mild - rarely use inhaler - chemical induced  . PP care - s/p SVD 12/28 05/15/2011   svd x 2  . Seasonal allergies     Objective: Physical Exam General: The patient is alert and oriented x3 in no acute distress.  Dermatology: Hyperkeratotic skin lesion noted to the plantar aspect of the left foot approximately 1 cm in diameter. Pinpoint bleeding noted upon debridement. Skin is warm, dry and supple bilateral lower extremities. Negative for open lesions or macerations.  Vascular: Palpable pedal pulses bilaterally. No edema or erythema noted. Capillary refill within normal limits.  Neurological: Epicritic and protective threshold grossly intact bilaterally.   Musculoskeletal Exam: Pain on palpation to the note skin lesion.  Range of motion within normal limits to all pedal and ankle joints bilateral. Muscle strength 5/5 in all groups bilateral.   Assessment: #1 plantar wart left foot #2 pain in left foot   Plan of Care:  #1 Patient was evaluated. #2 Excisional debridement of the plantar wart lesion was performed using a chisel blade. Cantharone was applied and the lesion was dressed with a dry sterile dressing. #3 patient is to return to clinic in 2 weeks  Felecia ShellingBrent M. Agnieszka Newhouse, DPM [facility]  Dr. Felecia ShellingBrent M. Caretha Rumbaugh, DPM    [address]                                        [place], [place] [address]                Office [phone](336) [phone]  Fax [phone](336) [phone]

## 2017-05-28 ENCOUNTER — Ambulatory Visit: Payer: BLUE CROSS/BLUE SHIELD | Admitting: Podiatry

## 2017-05-31 ENCOUNTER — Telehealth: Payer: Self-pay | Admitting: *Deleted

## 2017-05-31 NOTE — Telephone Encounter (Signed)
Pt states the wart remover Dr. Logan BoresEvans put on her foot 05/24/2017 did nothing and the area is still very sore, has an appt 06/07/2017 should she come in earlier. I contacted pt and told her I felt she would benefit from an earlier appt, and transferred to schedulers.

## 2017-06-02 ENCOUNTER — Ambulatory Visit (INDEPENDENT_AMBULATORY_CARE_PROVIDER_SITE_OTHER): Payer: BLUE CROSS/BLUE SHIELD | Admitting: Podiatry

## 2017-06-02 ENCOUNTER — Encounter: Payer: Self-pay | Admitting: Podiatry

## 2017-06-02 DIAGNOSIS — B070 Plantar wart: Secondary | ICD-10-CM

## 2017-06-04 ENCOUNTER — Telehealth: Payer: Self-pay | Admitting: Podiatry

## 2017-06-04 NOTE — Telephone Encounter (Signed)
I saw Dr. Logan BoresEvans Wednesday and he removed part of a callus and wart on the bottom of my foot. I was up on it quite a bit Wednesday and yesterday and my foot was swelling yesterday. It has gone down some but it still has some swelling and it has this one red mark on the back of it that's hot. Its just a little bit bigger than the other one. I was wondering if there is anything I can do to keep the swelling down other than elevation or if there is any other recommendations. If you would please call me back at [phone]. It does hurt but again I was on my foot a lot the past two day. Thank you. Bye.

## 2017-06-04 NOTE — Telephone Encounter (Signed)
I informed pt she should cleanse the site daily with soap and water, rinse and apply antibiotic ointment and bandaid and pad off area for comfort, ice 3-4 times a day 15 minutes/session for comfort and inflammation and take the ibuprofen as instructed for pain and inflammation and back off on extra activities. Pt states she doesn't have drainage, only redness at the site.

## 2017-06-06 NOTE — Progress Notes (Signed)
   Subjective: Patient presents today for follow up evaluation of a plantar wart of the left foot. She reports continued pain but states it has improved from what it was initially. Patient is here for further evaluation and treatment.   Past Medical History:  Diagnosis Date  . Asthma    mild - rarely use inhaler - chemical induced  . PP care - s/p SVD 12/28 05/15/2011   svd x 2  . Seasonal allergies     Objective: Physical Exam General: The patient is alert and oriented x3 in no acute distress.  Dermatology: Hyperkeratotic skin lesion noted to the plantar aspect of the left foot approximately 1 cm in diameter. Pinpoint bleeding noted upon debridement. Skin is warm, dry and supple bilateral lower extremities. Negative for open lesions or macerations.  Vascular: Palpable pedal pulses bilaterally. No edema or erythema noted. Capillary refill within normal limits.  Neurological: Epicritic and protective threshold grossly intact bilaterally.   Musculoskeletal Exam: Pain on palpation to the note skin lesion.  Range of motion within normal limits to all pedal and ankle joints bilateral. Muscle strength 5/5 in all groups bilateral.   Assessment: #1 plantar wart left foot - improved #2 pain in left foot - improved   Plan of Care:  #1 Patient was evaluated. #2 Excisional debridement of the plantar wart lesion was performed using a chisel blade. Cantharone was applied and the lesion was dressed with a dry sterile dressing. #3 patient is to return to clinic in 2 weeks  [facility] [place].  Felecia ShellingBrent M. Evans, DPM [facility]  Dr. Felecia ShellingBrent M. Evans, DPM    [address]                                        [place], [place] [address]                Office [phone]  Fax [phone]

## 2017-06-07 ENCOUNTER — Ambulatory Visit: Payer: BLUE CROSS/BLUE SHIELD | Admitting: Podiatry

## 2017-06-16 ENCOUNTER — Ambulatory Visit (INDEPENDENT_AMBULATORY_CARE_PROVIDER_SITE_OTHER): Payer: BLUE CROSS/BLUE SHIELD | Admitting: Podiatry

## 2017-06-16 ENCOUNTER — Encounter: Payer: Self-pay | Admitting: Podiatry

## 2017-06-16 DIAGNOSIS — B070 Plantar wart: Secondary | ICD-10-CM | POA: Diagnosis not present

## 2017-06-16 MED ORDER — NONFORMULARY OR COMPOUNDED ITEM: 0 refills | Status: AC

## 2017-06-17 DIAGNOSIS — H5213 Myopia, bilateral: Secondary | ICD-10-CM | POA: Diagnosis not present

## 2017-06-19 NOTE — Progress Notes (Signed)
   Subjective: Patient presents today for follow up evaluation of a plantar wart of the left foot. She states the wart is raw and sore but is improving. She has been applying Neosporin and icing the area since her previous visit. She sprays an antiseptic on the area when she experiences itching. Patient is here for further evaluation and treatment.   Past Medical History:  Diagnosis Date  . Asthma    mild - rarely use inhaler - chemical induced  . PP care - s/p SVD 12/28 05/15/2011   svd x 2  . Seasonal allergies     Objective: Physical Exam General: The patient is alert and oriented x3 in no acute distress.  Dermatology: Hyperkeratotic skin lesion noted to the plantar aspect of the left foot approximately 1 cm in diameter. Pinpoint bleeding noted upon debridement. Skin is warm, dry and supple bilateral lower extremities. Negative for open lesions or macerations.  Vascular: Palpable pedal pulses bilaterally. No edema or erythema noted. Capillary refill within normal limits.  Neurological: Epicritic and protective threshold grossly intact bilaterally.   Musculoskeletal Exam: Pain on palpation to the note skin lesion.  Range of motion within normal limits to all pedal and ankle joints bilateral. Muscle strength 5/5 in all groups bilateral.   Assessment: #1 plantar wart left foot - improved #2 pain in left foot - improved   Plan of Care:  #1 Patient was evaluated. #2 Light debridement of the plantar wart lesion was performed using a chisel blade. Dressed with a dry sterile dressing. #3 Recommended good shoe gear. #4 Return to clinic as needed.  [facility] [place].  Felecia ShellingBrent M. Lanard Arguijo, DPM [facility]  Dr. Felecia ShellingBrent M. Nahara Dona, DPM    [address]                                        [place], [place] [address]                Office [phone](336) [phone]  Fax [phone]

## 2018-03-21 DIAGNOSIS — Z1231 Encounter for screening mammogram for malignant neoplasm of breast: Secondary | ICD-10-CM | POA: Diagnosis not present

## 2018-03-21 DIAGNOSIS — Z01419 Encounter for gynecological examination (general) (routine) without abnormal findings: Secondary | ICD-10-CM | POA: Diagnosis not present

## 2018-03-21 DIAGNOSIS — Z23 Encounter for immunization: Secondary | ICD-10-CM | POA: Diagnosis not present

## 2018-05-28 DIAGNOSIS — R5383 Other fatigue: Secondary | ICD-10-CM | POA: Diagnosis not present

## 2019-03-06 DIAGNOSIS — Z23 Encounter for immunization: Secondary | ICD-10-CM | POA: Diagnosis not present

## 2019-05-25 DIAGNOSIS — Z13220 Encounter for screening for lipoid disorders: Secondary | ICD-10-CM | POA: Diagnosis not present

## 2019-05-25 DIAGNOSIS — R42 Dizziness and giddiness: Secondary | ICD-10-CM | POA: Diagnosis not present

## 2019-05-25 DIAGNOSIS — Z23 Encounter for immunization: Secondary | ICD-10-CM | POA: Diagnosis not present

## 2019-05-25 DIAGNOSIS — J45909 Unspecified asthma, uncomplicated: Secondary | ICD-10-CM | POA: Diagnosis not present

## 2019-05-25 DIAGNOSIS — Z0000 Encounter for general adult medical examination without abnormal findings: Secondary | ICD-10-CM | POA: Diagnosis not present

## 2019-05-29 ENCOUNTER — Other Ambulatory Visit: Payer: BLUE CROSS/BLUE SHIELD

## 2019-06-15 DIAGNOSIS — H5213 Myopia, bilateral: Secondary | ICD-10-CM | POA: Diagnosis not present

## 2019-06-23 ENCOUNTER — Ambulatory Visit: Payer: BLUE CROSS/BLUE SHIELD | Attending: Internal Medicine

## 2019-06-23 DIAGNOSIS — Z20822 Contact with and (suspected) exposure to covid-19: Secondary | ICD-10-CM | POA: Diagnosis not present

## 2019-06-24 LAB — NOVEL CORONAVIRUS, NAA: SARS-CoV-2, NAA: NOT DETECTED

## 2020-01-17 ENCOUNTER — Other Ambulatory Visit: Payer: BC Managed Care – PPO

## 2020-01-17 ENCOUNTER — Other Ambulatory Visit: Payer: Self-pay | Admitting: Critical Care Medicine

## 2020-01-17 DIAGNOSIS — Z20822 Contact with and (suspected) exposure to covid-19: Secondary | ICD-10-CM

## 2020-01-18 ENCOUNTER — Ambulatory Visit: Payer: Self-pay | Admitting: *Deleted

## 2020-01-18 LAB — NOVEL CORONAVIRUS, NAA: SARS-CoV-2, NAA: NOT DETECTED

## 2020-01-18 NOTE — Telephone Encounter (Signed)
Patient called requesting covid results. Covid results still pending. Reviewed with patient when test results confirmed she would receive a call back and notified via My Chart with results. Patient verbalized understanding .

## 2020-03-23 DIAGNOSIS — Z23 Encounter for immunization: Secondary | ICD-10-CM | POA: Diagnosis not present

## 2020-09-09 ENCOUNTER — Ambulatory Visit: Payer: BC Managed Care – PPO | Attending: Internal Medicine

## 2020-09-09 DIAGNOSIS — Z20822 Contact with and (suspected) exposure to covid-19: Secondary | ICD-10-CM

## 2020-09-10 LAB — SARS-COV-2, NAA 2 DAY TAT

## 2020-09-10 LAB — NOVEL CORONAVIRUS, NAA: SARS-CoV-2, NAA: DETECTED — AB

## 2020-09-11 ENCOUNTER — Telehealth (HOSPITAL_COMMUNITY): Payer: Self-pay

## 2020-09-11 NOTE — Telephone Encounter (Signed)
Called to discuss with patient about COVID-19 symptoms and the use of one of the available treatments for those with mild to moderate Covid symptoms and at a high risk of hospitalization.  Pt may appear to qualify for outpatient treatment due to co-morbid conditions and/or a member of an at-risk group in accordance with the FDA Emergency Use Authorization.     Unable to reach pt - LVM 09/11/20 @ 1904  Essie Hart, RN

## 2020-09-24 DIAGNOSIS — Z01411 Encounter for gynecological examination (general) (routine) with abnormal findings: Secondary | ICD-10-CM | POA: Diagnosis not present

## 2020-09-24 DIAGNOSIS — Z01419 Encounter for gynecological examination (general) (routine) without abnormal findings: Secondary | ICD-10-CM | POA: Diagnosis not present

## 2020-09-24 DIAGNOSIS — Z6825 Body mass index (BMI) 25.0-25.9, adult: Secondary | ICD-10-CM | POA: Diagnosis not present

## 2020-09-24 DIAGNOSIS — N841 Polyp of cervix uteri: Secondary | ICD-10-CM | POA: Diagnosis not present

## 2020-09-24 DIAGNOSIS — Z124 Encounter for screening for malignant neoplasm of cervix: Secondary | ICD-10-CM | POA: Diagnosis not present

## 2020-09-24 DIAGNOSIS — G43829 Menstrual migraine, not intractable, without status migrainosus: Secondary | ICD-10-CM | POA: Diagnosis not present

## 2020-09-24 DIAGNOSIS — Z1231 Encounter for screening mammogram for malignant neoplasm of breast: Secondary | ICD-10-CM | POA: Diagnosis not present

## 2021-02-04 DIAGNOSIS — Z20822 Contact with and (suspected) exposure to covid-19: Secondary | ICD-10-CM | POA: Diagnosis not present

## 2021-02-05 DIAGNOSIS — Z20822 Contact with and (suspected) exposure to covid-19: Secondary | ICD-10-CM | POA: Diagnosis not present

## 2021-05-06 DIAGNOSIS — L814 Other melanin hyperpigmentation: Secondary | ICD-10-CM | POA: Diagnosis not present

## 2021-09-15 DIAGNOSIS — J029 Acute pharyngitis, unspecified: Secondary | ICD-10-CM | POA: Diagnosis not present

## 2021-09-15 DIAGNOSIS — R5383 Other fatigue: Secondary | ICD-10-CM | POA: Diagnosis not present

## 2021-09-15 DIAGNOSIS — J0100 Acute maxillary sinusitis, unspecified: Secondary | ICD-10-CM | POA: Diagnosis not present

## 2021-11-25 DIAGNOSIS — Z6825 Body mass index (BMI) 25.0-25.9, adult: Secondary | ICD-10-CM | POA: Diagnosis not present

## 2021-11-25 DIAGNOSIS — Z1329 Encounter for screening for other suspected endocrine disorder: Secondary | ICD-10-CM | POA: Diagnosis not present

## 2021-11-25 DIAGNOSIS — R87615 Unsatisfactory cytologic smear of cervix: Secondary | ICD-10-CM | POA: Diagnosis not present

## 2021-11-25 DIAGNOSIS — Z131 Encounter for screening for diabetes mellitus: Secondary | ICD-10-CM | POA: Diagnosis not present

## 2021-11-25 DIAGNOSIS — Z1231 Encounter for screening mammogram for malignant neoplasm of breast: Secondary | ICD-10-CM | POA: Diagnosis not present

## 2021-11-25 DIAGNOSIS — Z0000 Encounter for general adult medical examination without abnormal findings: Secondary | ICD-10-CM | POA: Diagnosis not present

## 2021-11-25 DIAGNOSIS — Z13220 Encounter for screening for lipoid disorders: Secondary | ICD-10-CM | POA: Diagnosis not present

## 2021-11-25 DIAGNOSIS — Z01419 Encounter for gynecological examination (general) (routine) without abnormal findings: Secondary | ICD-10-CM | POA: Diagnosis not present

## 2021-12-15 DIAGNOSIS — Z1211 Encounter for screening for malignant neoplasm of colon: Secondary | ICD-10-CM | POA: Diagnosis not present

## 2021-12-15 DIAGNOSIS — Z1212 Encounter for screening for malignant neoplasm of rectum: Secondary | ICD-10-CM | POA: Diagnosis not present

## 2023-07-02 DIAGNOSIS — Z1231 Encounter for screening mammogram for malignant neoplasm of breast: Secondary | ICD-10-CM | POA: Diagnosis not present

## 2023-07-02 DIAGNOSIS — G43829 Menstrual migraine, not intractable, without status migrainosus: Secondary | ICD-10-CM | POA: Diagnosis not present

## 2023-07-02 DIAGNOSIS — Z01419 Encounter for gynecological examination (general) (routine) without abnormal findings: Secondary | ICD-10-CM | POA: Diagnosis not present

## 2023-07-02 DIAGNOSIS — G479 Sleep disorder, unspecified: Secondary | ICD-10-CM | POA: Diagnosis not present

## 2023-07-02 DIAGNOSIS — Z1331 Encounter for screening for depression: Secondary | ICD-10-CM | POA: Diagnosis not present

## 2023-09-30 DIAGNOSIS — Z6825 Body mass index (BMI) 25.0-25.9, adult: Secondary | ICD-10-CM | POA: Diagnosis not present

## 2023-09-30 DIAGNOSIS — Z0000 Encounter for general adult medical examination without abnormal findings: Secondary | ICD-10-CM | POA: Diagnosis not present

## 2023-09-30 DIAGNOSIS — J4520 Mild intermittent asthma, uncomplicated: Secondary | ICD-10-CM | POA: Diagnosis not present

## 2023-09-30 DIAGNOSIS — R42 Dizziness and giddiness: Secondary | ICD-10-CM | POA: Diagnosis not present

## 2023-10-12 DIAGNOSIS — Z13220 Encounter for screening for lipoid disorders: Secondary | ICD-10-CM | POA: Diagnosis not present

## 2023-10-12 DIAGNOSIS — Z1329 Encounter for screening for other suspected endocrine disorder: Secondary | ICD-10-CM | POA: Diagnosis not present

## 2023-10-12 DIAGNOSIS — R42 Dizziness and giddiness: Secondary | ICD-10-CM | POA: Diagnosis not present

## 2023-12-23 DIAGNOSIS — L578 Other skin changes due to chronic exposure to nonionizing radiation: Secondary | ICD-10-CM | POA: Diagnosis not present

## 2023-12-23 DIAGNOSIS — D225 Melanocytic nevi of trunk: Secondary | ICD-10-CM | POA: Diagnosis not present
